# Patient Record
Sex: Male | Born: 1985 | Race: Black or African American | Hispanic: No | Marital: Married | Smoking: Never smoker
Health system: Southern US, Community
[De-identification: ages and names within clinical notes are randomized; demographics above are authoritative.]

## PROBLEM LIST (undated history)

## (undated) DIAGNOSIS — I499 Cardiac arrhythmia, unspecified: Secondary | ICD-10-CM

---

## 2008-03-15 ENCOUNTER — Emergency Department (HOSPITAL_COMMUNITY): Admission: EM | Admit: 2008-03-15 | Discharge: 2008-03-16 | Payer: Self-pay | Admitting: Emergency Medicine

## 2010-04-03 ENCOUNTER — Emergency Department (HOSPITAL_COMMUNITY): Admission: EM | Admit: 2010-04-03 | Discharge: 2010-04-03 | Payer: Self-pay | Admitting: Family Medicine

## 2010-04-05 ENCOUNTER — Emergency Department (HOSPITAL_COMMUNITY): Admission: EM | Admit: 2010-04-05 | Discharge: 2010-04-05 | Payer: Self-pay | Admitting: Emergency Medicine

## 2010-04-08 ENCOUNTER — Emergency Department (HOSPITAL_COMMUNITY): Admission: EM | Admit: 2010-04-08 | Discharge: 2010-04-08 | Payer: Self-pay | Admitting: Family Medicine
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
## 2017-11-07 ENCOUNTER — Encounter (HOSPITAL_COMMUNITY): Payer: Self-pay | Admitting: Emergency Medicine

## 2017-11-07 ENCOUNTER — Emergency Department (HOSPITAL_COMMUNITY): Payer: Self-pay

## 2017-11-07 ENCOUNTER — Emergency Department (HOSPITAL_COMMUNITY)
Admission: EM | Admit: 2017-11-07 | Discharge: 2017-11-07 | Disposition: A | Discharge status: Home / Self Care | Payer: Self-pay | Attending: Emergency Medicine | Admitting: Emergency Medicine

## 2017-11-07 ENCOUNTER — Other Ambulatory Visit: Payer: Self-pay

## 2017-11-07 DIAGNOSIS — R002 Palpitations: Secondary | ICD-10-CM

## 2017-11-07 HISTORY — DX: Cardiac arrhythmia, unspecified: I49.9

## 2017-11-07 LAB — BASIC METABOLIC PANEL
Anion gap: 4 — ABNORMAL LOW (ref 5–15)
BUN: 13 mg/dL (ref 6–20)
CHLORIDE: 104 mmol/L (ref 101–111)
CO2: 28 mmol/L (ref 22–32)
CREATININE: 1.15 mg/dL (ref 0.61–1.24)
Calcium: 8.7 mg/dL — ABNORMAL LOW (ref 8.9–10.3)
GFR calc Af Amer: >60 mL/min (ref >60)
GLUCOSE: 97 mg/dL (ref 65–99)
POTASSIUM: 3.9 mmol/L (ref 3.5–5.1)
Sodium: 136 mmol/L (ref 135–145)

## 2017-11-07 LAB — CBC
HEMATOCRIT: 39.5 % (ref 39.0–52.0)
Hemoglobin: 13.3 g/dL (ref 13.0–17.0)
MCH: 30.4 pg (ref 26.0–34.0)
MCHC: 33.7 g/dL (ref 30.0–36.0)
MCV: 90.2 fL (ref 78.0–100.0)
PLATELETS: 222 10*3/uL (ref 150–400)
RBC: 4.38 MIL/uL (ref 4.22–5.81)
RDW: 12.3 % (ref 11.5–15.5)
WBC: 5.7 10*3/uL (ref 4.0–10.5)

## 2017-11-07 NOTE — ED Notes (Signed)
Pt is alert and oriented x 4 and is verbally responsive. Pt reports that he feels his heart beats fast on and off and that it has been happening for years but has some Associated SOB that motivated him to come to the ED to be evaluate. Pt denies pain at this time.

## 2017-11-07 NOTE — Discharge Instructions (Signed)
Return if any problems.

## 2017-11-07 NOTE — ED Triage Notes (Signed)
Pt c/o intermittent irregular heart beats, pt states he has these episodes x several years, pt states time between episodes may be hours, days, months, etc. Pt states episodes occur at any time regardless of activity.
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
## 2018-02-11 ENCOUNTER — Emergency Department (HOSPITAL_COMMUNITY)
Admission: EM | Admit: 2018-02-11 | Discharge: 2018-02-11 | Disposition: A | Discharge status: Home / Self Care | Payer: BLUE CROSS/BLUE SHIELD | Attending: Emergency Medicine | Admitting: Emergency Medicine

## 2018-02-11 ENCOUNTER — Other Ambulatory Visit: Payer: Self-pay

## 2018-02-11 ENCOUNTER — Encounter (HOSPITAL_COMMUNITY): Payer: Self-pay

## 2018-02-11 DIAGNOSIS — Z79899 Other long term (current) drug therapy: Secondary | ICD-10-CM | POA: Diagnosis not present

## 2018-02-11 DIAGNOSIS — M238X1 Other internal derangements of right knee: Secondary | ICD-10-CM | POA: Diagnosis not present

## 2018-02-11 DIAGNOSIS — M25511 Pain in right shoulder: Secondary | ICD-10-CM | POA: Diagnosis not present

## 2018-02-11 DIAGNOSIS — F1210 Cannabis abuse, uncomplicated: Secondary | ICD-10-CM | POA: Insufficient documentation
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
## 2018-02-11 NOTE — Discharge Instructions (Signed)
Please read instructions below. Apply ice to your shoulder for 20 minutes at a time. You can take Advil/ibuprofen every 6 hours as needed for pain. Schedule an appointment with the orthopedic specialist for follow-up on your injury if symptoms persist. Return to the ER for new or concerning symptoms.

## 2018-12-13 IMAGING — DX DG CHEST 2V
2 series · 2 of 2 positions shown · non-contrast
Comparison: None.

CLINICAL DATA: Shortness of breath, palpitations

EXAM:
CHEST  2 VIEW

[chest pa]
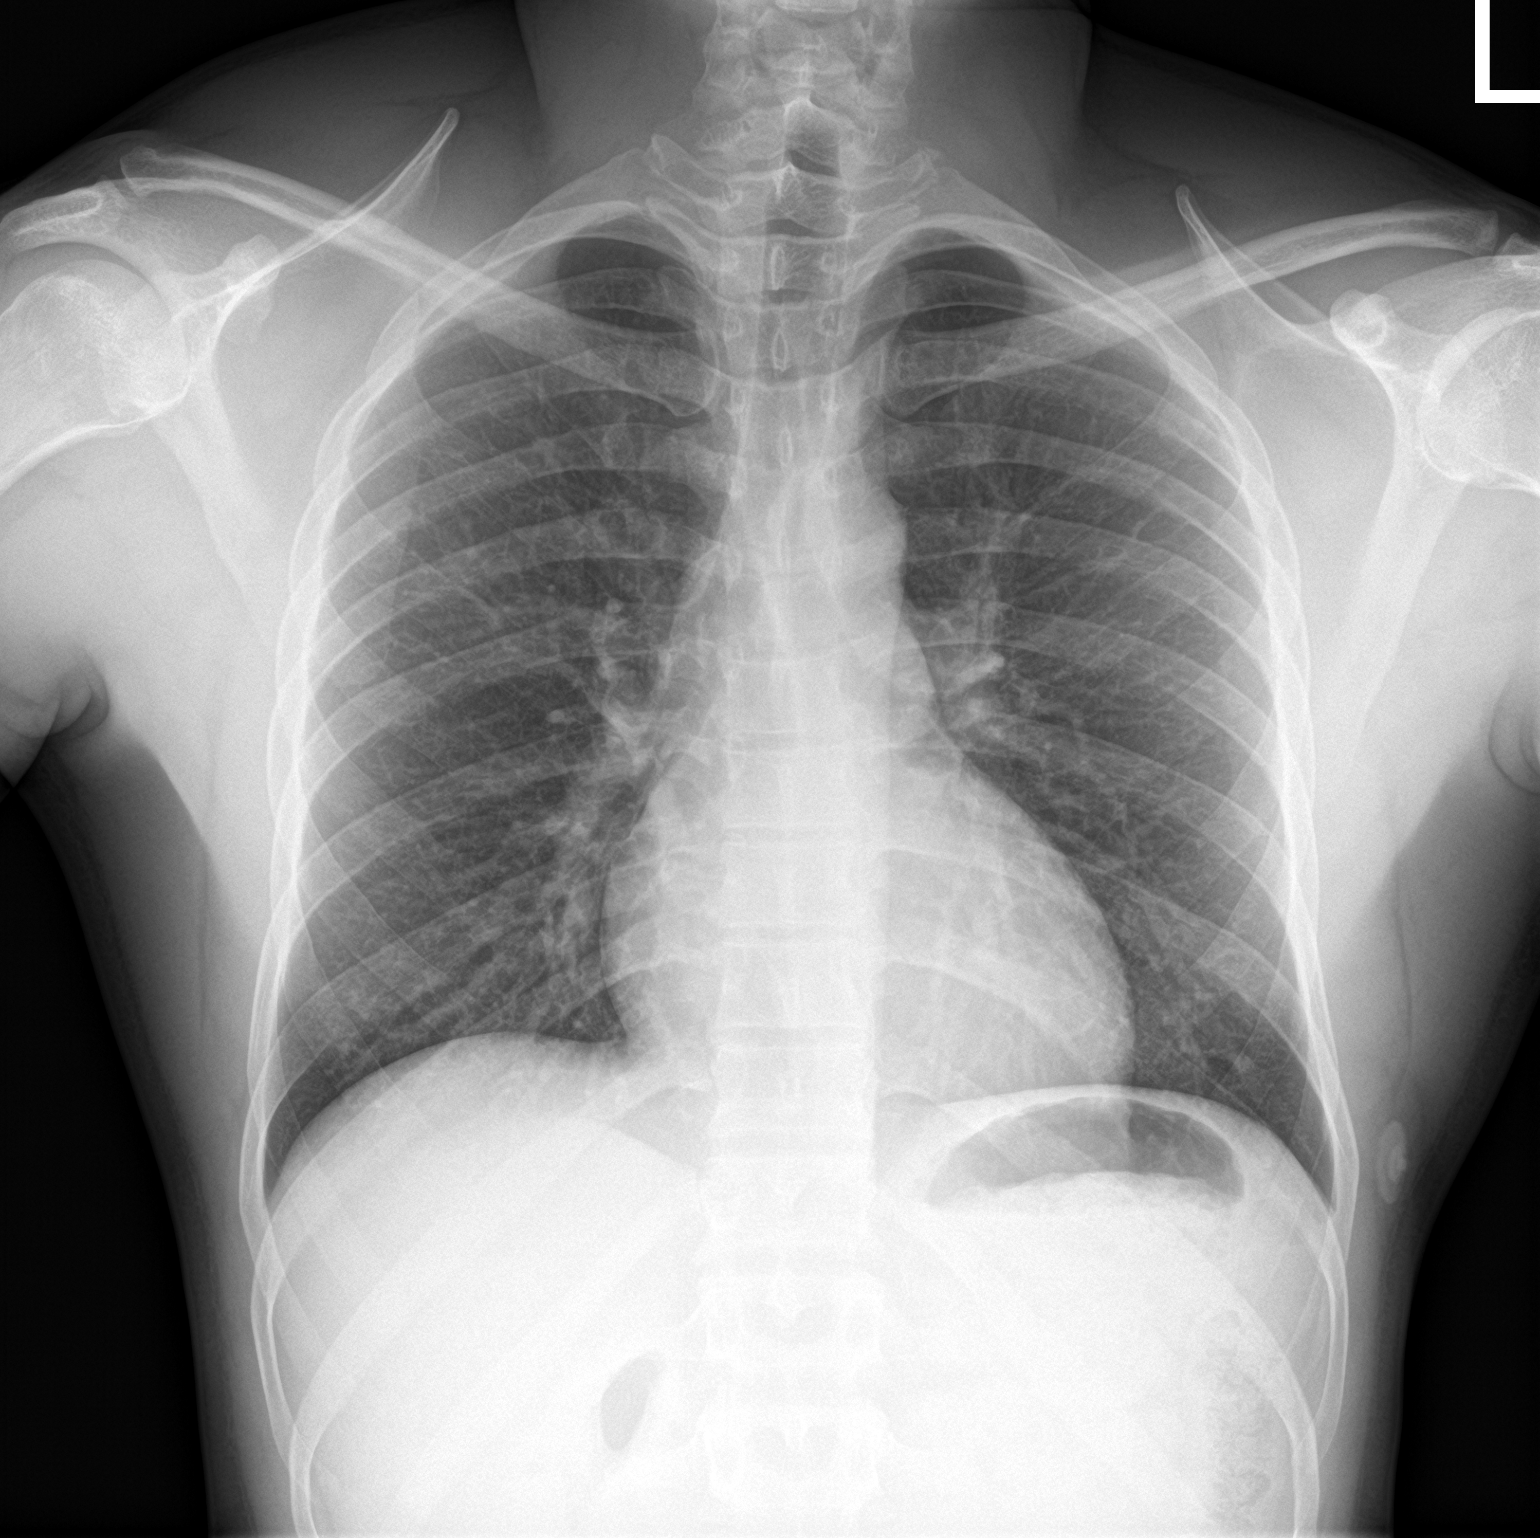

[chest lat]
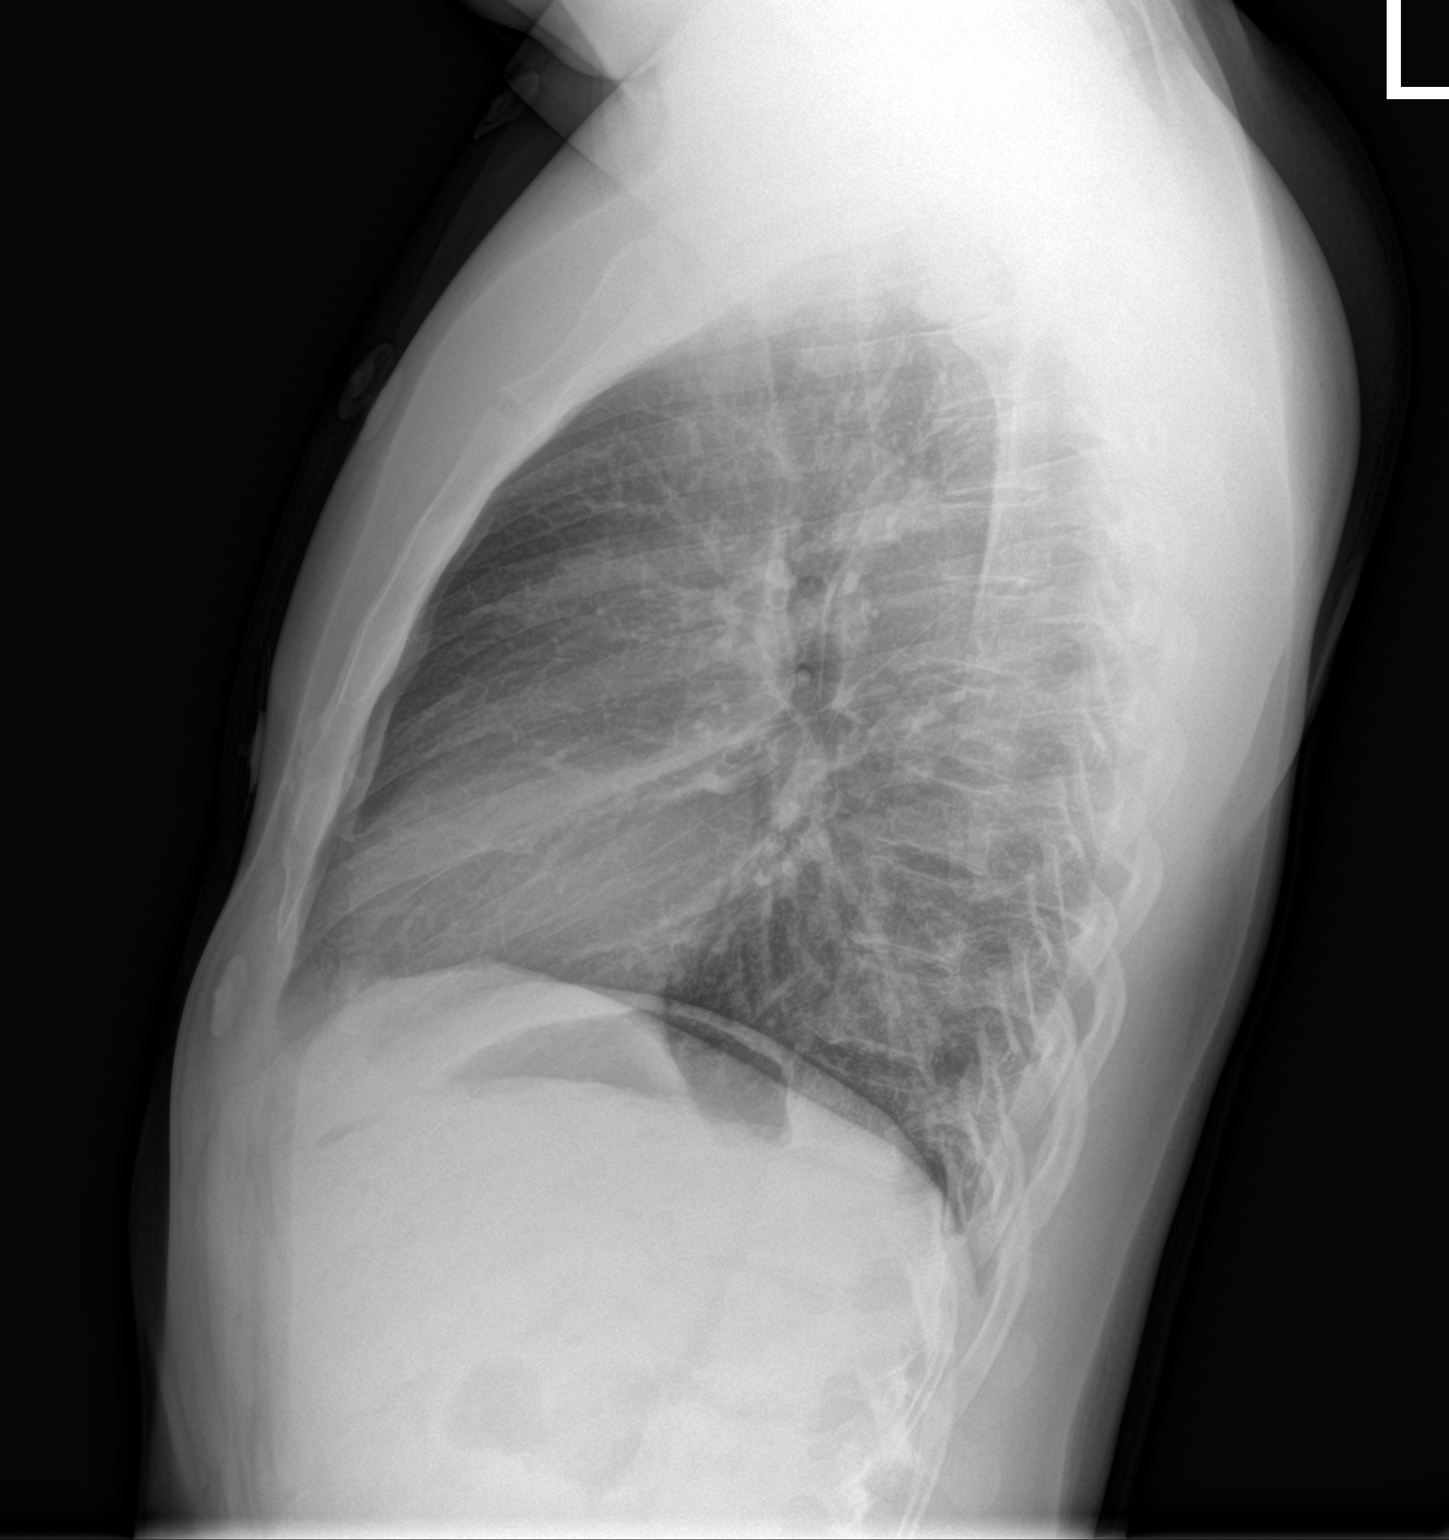

[2 of 2 positions shown; findings below may reference images not displayed]

FINDINGS: Lungs are clear.  No pleural effusion or pneumothorax.

The heart is normal in size.

Visualized osseous structures are within normal limits.
IMPRESSION: Normal chest radiographs.

## 2020-02-22 ENCOUNTER — Ambulatory Visit: Payer: Self-pay | Attending: Internal Medicine

## 2020-02-22 DIAGNOSIS — Z23 Encounter for immunization: Secondary | ICD-10-CM

## 2020-02-22 NOTE — Progress Notes (Signed)
   Covid-19 Vaccination Clinic  Name:  Saladin Petrelli    MRN: [id number] DOB: [birth year]  02/22/2020  Mr. Casteneda was observed post Covid-19 immunization for 15 minutes without incident. He was provided with Vaccine Information Sheet and instruction to access the V-Safe system.   Mr. Capri was instructed to call 911 with any severe reactions post vaccine: Marland Kitchen Difficulty breathing  . Swelling of face and throat  . A fast heartbeat  . A bad rash all over body  . Dizziness and weakness   Immunizations Administered    Name Date Dose VIS Date Route   Pfizer COVID-19 Vaccine 02/22/2020  8:27 AM 0.3 mL 12/19/2018 Intramuscular   Manufacturer: [employer], [employer]   Lot: [id number]   NDC: [address]-3

## 2020-03-17 ENCOUNTER — Ambulatory Visit: Payer: Self-pay | Attending: Internal Medicine

## 2020-03-17 DIAGNOSIS — Z23 Encounter for immunization: Secondary | ICD-10-CM

## 2020-03-17 NOTE — Progress Notes (Signed)
   Covid-19 Vaccination Clinic  Name:  Julian Duran    MRN: [id number] DOB: 12-12-1985  03/17/2020  [name] was observed post Covid-19 immunization for 15 minutes without incident. He was provided with Vaccine Information Sheet and instruction to access the V-Safe system.   [name] was instructed to call 911 with any severe reactions post vaccine: Marland Kitchen Difficulty breathing  . Swelling of face and throat  . A fast heartbeat  . A bad rash all over body  . Dizziness and weakness   Immunizations Administered    Name Date Dose VIS Date Route   Pfizer COVID-19 Vaccine 03/17/2020  8:18 AM 0.3 mL 12/19/2018 Intramuscular   Manufacturer: [employer], [employer]   Lot: [id number]   NDC: [address]-2
